# Patient Record
Sex: Female | Born: 1990 | Race: White | Hispanic: No | Marital: Single | Smoking: Never smoker
Health system: Southern US, Community
[De-identification: ages and names within clinical notes are randomized; demographics above are authoritative.]

## PROBLEM LIST (undated history)

## (undated) DIAGNOSIS — N200 Calculus of kidney: Secondary | ICD-10-CM

---

## 2011-11-24 HISTORY — PX: INTESTINAL BYPASS: SHX1099

## 2013-08-13 ENCOUNTER — Encounter (HOSPITAL_COMMUNITY): Payer: Self-pay | Admitting: Emergency Medicine

## 2013-08-13 ENCOUNTER — Emergency Department (HOSPITAL_COMMUNITY)

## 2013-08-13 ENCOUNTER — Emergency Department (HOSPITAL_COMMUNITY)
Admission: EM | Admit: 2013-08-13 | Discharge: 2013-08-13 | Disposition: A | Discharge status: Home / Self Care | Attending: Emergency Medicine | Admitting: Emergency Medicine

## 2013-08-13 DIAGNOSIS — Z3202 Encounter for pregnancy test, result negative: Secondary | ICD-10-CM | POA: Insufficient documentation

## 2013-08-13 DIAGNOSIS — R110 Nausea: Secondary | ICD-10-CM | POA: Insufficient documentation

## 2013-08-13 DIAGNOSIS — Z79899 Other long term (current) drug therapy: Secondary | ICD-10-CM | POA: Insufficient documentation

## 2013-08-13 DIAGNOSIS — N200 Calculus of kidney: Secondary | ICD-10-CM | POA: Insufficient documentation

## 2013-08-13 HISTORY — DX: Calculus of kidney: N20.0

## 2013-08-13 LAB — URINALYSIS, ROUTINE W REFLEX MICROSCOPIC
Glucose, UA: NEGATIVE mg/dL
Hgb urine dipstick: NEGATIVE
Ketones, ur: NEGATIVE mg/dL
Leukocytes, UA: NEGATIVE
Nitrite: NEGATIVE
Protein, ur: NEGATIVE mg/dL
Urobilinogen, UA: 0.2 mg/dL (ref 0.0–1.0)

## 2013-08-13 LAB — CBC WITH DIFFERENTIAL/PLATELET
Basophils Absolute: 0 10*3/uL (ref 0.0–0.1)
Basophils Relative: 0 % (ref 0–1)
Eosinophils Absolute: 0.1 10*3/uL (ref 0.0–0.7)
Eosinophils Relative: 1 % (ref 0–5)
HCT: 34.7 % — ABNORMAL LOW (ref 36.0–46.0)
Lymphocytes Relative: 49 % — ABNORMAL HIGH (ref 12–46)
MCH: 28.3 pg (ref 26.0–34.0)
MCHC: 33.4 g/dL (ref 30.0–36.0)
MCV: 84.6 fL (ref 78.0–100.0)
Monocytes Absolute: 0.8 10*3/uL (ref 0.1–1.0)
Neutrophils Relative %: 41 % — ABNORMAL LOW (ref 43–77)
Platelets: 233 10*3/uL (ref 150–400)
RBC: 4.1 MIL/uL (ref 3.87–5.11)
RDW: 13.5 % (ref 11.5–15.5)

## 2013-08-13 LAB — COMPREHENSIVE METABOLIC PANEL
AST: 18 U/L (ref 0–37)
CO2: 27 mEq/L (ref 19–32)
Calcium: 9.4 mg/dL (ref 8.4–10.5)
Chloride: 104 mEq/L (ref 96–112)
Creatinine, Ser: 0.56 mg/dL (ref 0.50–1.10)
GFR calc non Af Amer: >90 mL/min (ref >90)
Sodium: 139 mEq/L (ref 135–145)
Total Bilirubin: 0.1 mg/dL — ABNORMAL LOW (ref 0.3–1.2)
Total Protein: 6.5 g/dL (ref 6.0–8.3)

## 2013-08-13 LAB — LIPASE, BLOOD: Lipase: 24 U/L (ref 11–59)

## 2013-08-13 LAB — POCT PREGNANCY, URINE: Preg Test, Ur: NEGATIVE

## 2013-08-13 MED ORDER — IOHEXOL 300 MG/ML  SOLN
50.0000 mL | Freq: Once | INTRAMUSCULAR | Status: AC | PRN
  Administered 2013-08-13: 50 mL via ORAL

## 2013-08-13 MED ORDER — OXYCODONE-ACETAMINOPHEN 5-325 MG PO TABS: 1.0000 | ORAL_TABLET | Freq: Three times a day (TID) | ORAL | Status: AC | PRN

## 2013-08-13 MED ORDER — SODIUM CHLORIDE 0.9 % IV SOLN
Freq: Once | INTRAVENOUS | Status: AC
  Administered 2013-08-13: 50 mL/h via INTRAVENOUS

## 2013-08-13 MED ORDER — IOHEXOL 300 MG/ML  SOLN
100.0000 mL | Freq: Once | INTRAMUSCULAR | Status: AC | PRN
  Administered 2013-08-13: 100 mL via INTRAVENOUS

## 2013-08-13 MED ORDER — MORPHINE SULFATE 4 MG/ML IJ SOLN
4.0000 mg | Freq: Once | INTRAMUSCULAR | Status: AC
  Administered 2013-08-13: 4 mg via INTRAVENOUS
  Filled 2013-08-13: qty 1

## 2013-08-13 MED ORDER — CIPROFLOXACIN HCL 250 MG PO TABS: 250.0000 mg | ORAL_TABLET | Freq: Two times a day (BID) | ORAL | Status: AC

## 2013-08-13 MED ORDER — ONDANSETRON HCL 4 MG/2ML IJ SOLN
4.0000 mg | Freq: Once | INTRAMUSCULAR | Status: AC
  Administered 2013-08-13: 4 mg via INTRAVENOUS
  Filled 2013-08-13: qty 2

## 2013-08-13 MED ORDER — ONDANSETRON HCL 4 MG PO TABS: 4.0000 mg | ORAL_TABLET | Freq: Four times a day (QID) | ORAL | Status: AC

## 2013-08-13 NOTE — ED Provider Notes (Signed)
Discussed case with Earley Favor, NP. Transfer of care from Arty Baumgartner, NP at change in shift.  Patient presenting to emergency department with right lower quadrant pain does been ongoing for the past couple of days. At first the abdominal pain was of the bilateral discomfort, but has now become more concentrated on the right lower quadrant. Patient has history of kidney stones.  Urine pregnancy negative. Urine negative for infection and pyelonephritis. CBC negative elevation white blood cell count. CMP negative findings. Lipase negative elevation. CT abdomen and pelvis with contrast be performed to rule out possible appendicitis.  CT abdomen and pelvis with contrast identified no acute abdominal processes, normal appendix pulling appendicitis. Right nephrolithiasis identified-1 mm calcification in the interpolar region of the right renal collecting system, no hydronephrosis or ureterectasis identified.  7:50 AM Patient seen and re-assessed by this provider. Patient reported that she had vague abdominal pain starting on Friday and reported that the pain became more localized to the RLQ described as a sharp, intense pain that is constant to the RLQ. Patient reported that she felt nauseous intermittently. Patient stated that she felt mildly feverish on Friday, but denied taking her temperature. Patient reported that she has history of kidney stones - last episode was in April 2014. Reported that when she gets episodes of kidney stones the pain is mainly on the left side and she presents with a UTI prior to. Patient had bypass surgery in 2012. Denied dizziness, vomiting, diarrhea, hematuria, dysuria, weakness, chest pain, shortness of breath, difficulty breathing.  Patient alert and oriented. Interactive and pleasant throughout exam and interview. Lungs clear to auscultation bilaterally. Heart rate and rhythm normal. Pulses palpable and strong, distal and proximal. Bowel sounds normal active in all 4  quadrants. Discomfort upon palpation to the right lower quadrant. Negative pain upon palpation to the flow quadrant and left upper quadrant-negative Rovsing sign. Negative psoas and obturator. Patient appears comfortable. Rated pain as 2/10. Patient denied nausea. Discussed labs and imaging in great length with patient and her boyfriend. Discussed with patient that she has nephrolithiasis. Discussed with patient plan for discharge. Patient requesting antibiotics if UTI is to develop, reported that she normally gets Ciprofloxacin.   Results for orders placed during the hospital encounter of 08/13/13  CBC WITH DIFFERENTIAL      Result Value Range   WBC 8.5  4.0 - 10.5 K/uL   RBC 4.10  3.87 - 5.11 MIL/uL   Hemoglobin 11.6 (*) 12.0 - 15.0 g/dL   HCT 16.1 (*) 09.6 - 04.5 %   MCV 84.6  78.0 - 100.0 fL   MCH 28.3  26.0 - 34.0 pg   MCHC 33.4  30.0 - 36.0 g/dL   RDW 40.9  81.1 - 91.4 %   Platelets 233  150 - 400 K/uL   Neutrophils Relative % 41 (*) 43 - 77 %   Neutro Abs 3.5  1.7 - 7.7 K/uL   Lymphocytes Relative 49 (*) 12 - 46 %   Lymphs Abs 4.2 (*) 0.7 - 4.0 K/uL   Monocytes Relative 9  3 - 12 %   Monocytes Absolute 0.8  0.1 - 1.0 K/uL   Eosinophils Relative 1  0 - 5 %   Eosinophils Absolute 0.1  0.0 - 0.7 K/uL   Basophils Relative 0  0 - 1 %   Basophils Absolute 0.0  0.0 - 0.1 K/uL  COMPREHENSIVE METABOLIC PANEL      Result Value Range   Sodium 139  135 -  145 mEq/L   Potassium 3.8  3.5 - 5.1 mEq/L   Chloride 104  96 - 112 mEq/L   CO2 27  19 - 32 mEq/L   Glucose, Bld 86  70 - 99 mg/dL   BUN 15  6 - 23 mg/dL   Creatinine, Ser 8.11  0.50 - 1.10 mg/dL   Calcium 9.4  8.4 - 91.4 mg/dL   Total Protein 6.5  6.0 - 8.3 g/dL   Albumin 3.5  3.5 - 5.2 g/dL   AST 18  0 - 37 U/L   ALT 15  0 - 35 U/L   Alkaline Phosphatase 66  39 - 117 U/L   Total Bilirubin 0.1 (*) 0.3 - 1.2 mg/dL   GFR calc non Af Amer >90  >90 mL/min   GFR calc Af Amer >90  >90 mL/min  LIPASE, BLOOD      Result Value Range    Lipase 24  11 - 59 U/L  URINALYSIS, ROUTINE W REFLEX MICROSCOPIC      Result Value Range   Color, Urine YELLOW  YELLOW   APPearance CLEAR  CLEAR   Specific Gravity, Urine 1.021  1.005 - 1.030   pH 5.0  5.0 - 8.0   Glucose, UA NEGATIVE  NEGATIVE mg/dL   Hgb urine dipstick NEGATIVE  NEGATIVE   Bilirubin Urine NEGATIVE  NEGATIVE   Ketones, ur NEGATIVE  NEGATIVE mg/dL   Protein, ur NEGATIVE  NEGATIVE mg/dL   Urobilinogen, UA 0.2  0.0 - 1.0 mg/dL   Nitrite NEGATIVE  NEGATIVE   Leukocytes, UA NEGATIVE  NEGATIVE  POCT PREGNANCY, URINE      Result Value Range   Preg Test, Ur NEGATIVE  NEGATIVE   Ct Abdomen Pelvis W Contrast  08/13/2013   CLINICAL DATA:  right lower abdominal pain, nausea.  EXAM: CT ABDOMEN AND PELVIS WITH CONTRAST  TECHNIQUE: Multidetector CT imaging of the abdomen and pelvis was performed using the standard protocol following bolus administration of intravenous contrast.  CONTRAST:  OMNIPAQUE IOHEXOL 300 MG/ML  SOLN  COMPARISON:  06/15/2012  FINDINGS: Minimal dependent atelectasis in the visualized lung bases. Unremarkable liver, gallbladder, spleen, adrenal glands, left kidney, aorta, portal vein. 1 mm calcification in the interpolar region of the right renal collecting system. No hydronephrosis. No ureterectasis. Previous gastric bypass surgery. Small bowel and colon are nondilated. Normal appendix. Urinary bladder physiologically distended. Uterus and adnexal regions unremarkable. Trace pelvic ascites. No free air. No adenopathy. 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  IMPRESSION: 1. No acute abdominal process. 2. Right nephrolithiasis. 3. Normal appendix.   Electronically Signed   By: Oley Balm M.D.   On: 08/13/2013 06:40   BP 105/53  Pulse 54  Temp(Src) 98.5 F (36.9 C) (Oral)  Resp 16  Ht 5\' 5"  (1.651 m)  Wt 170 lb (77.111 kg)  BMI 28.29 kg/m2  SpO2 98%  LMP 07/30/2013   Patient stable, afebrile. Patient presenting to the ED  with right nephrolithiasis - normal appendix on the CT abdomen and pelvis with contrast. Discussed with patient findings and lab results. Patient's pain controlled in ED setting, as well as nausea. Patient ready to go home. Discharged patient with anti-emetics, pain medications, and Cipro. Referred patient to her PCP and urologist - patient reported that she follows Dr. Andrey Campanile in [place] and reported that she has an appointment with Dr. Andrey Campanile in 2 weeks - recommended patient to call and see if she can see the urologist sooner.  Discussed with patient to drink plenty of water. Had a long discussion with patient regarding what symptoms to watch for. Discussed with patient to continue to monitor symptoms and if symptoms are to worsen or change to report back to the ED - strict return instructions given.  Patient agreed to plan of care, understood, all questions answered.   Raymon Mutton, PA-C 08/13/13 1709

## 2013-08-13 NOTE — ED Provider Notes (Signed)
Medical screening examination/treatment/procedure(s) were performed by non-physician practitioner and as supervising physician I was immediately available for consultation/collaboration.  Alin Hutchins, MD 08/13/13 2353 

## 2013-08-13 NOTE — ED Provider Notes (Signed)
CSN: [id number]     Arrival date & time 08/13/13  0034 History   First MD Initiated Contact with Patient 08/13/13 0210     Chief Complaint  Patient presents with  . Abdominal Pain   (Consider location/radiation/quality/duration/timing/severity/associated sxs/prior Treatment) HPI Comments: Patient reports, 2, days of progressively worsening.  Right lower quadrant pain.  She has a history of kidney stones, but states this pain is totally different.  Her last menstrual cycle was 2 weeks, ago, which was normal.  Denies any dysuria, constipation, diarrhea, but does endorse nausea.  She's been taking over-the-counter Tylenol without any relief of her pain.  Does not have a previous history of ovarian cysts  Patient is a 22 y.o. female presenting with abdominal pain. The history is provided by the patient.  Abdominal Pain Pain location:  RLQ Pain quality: aching and sharp   Pain radiates to:  Does not radiate Pain severity:  Moderate Onset quality:  Gradual Duration:  2 days Timing:  Constant Progression:  Worsening Chronicity:  New Context: not alcohol use, not awakening from sleep, not diet changes, not eating, not laxative use, not medication withdrawal, not previous surgeries, not recent illness, not recent sexual activity, not recent travel, not retching, not sick contacts, not suspicious food intake and not trauma   Relieved by:  Nothing Worsened by:  Urination, palpation and movement Ineffective treatments:  Acetaminophen Associated symptoms: nausea   Associated symptoms: no constipation, no cough, no diarrhea, no dysuria, no fever, no flatus, no hematuria, no vaginal bleeding, no vaginal discharge and no vomiting   Nausea:    Severity:  Moderate   Onset quality:  Gradual   Past Medical History  Diagnosis Date  . Kidney stones    Past Surgical History  Procedure Laterality Date  . Intestinal bypass  2013   History reviewed. No pertinent family history. History  Substance Use  Topics  . Smoking status: Never Smoker   . Smokeless tobacco: Never Used  . Alcohol Use: Yes     Comment: socially   OB History   Grav Para Term Preterm Abortions TAB SAB Ect Mult Living                 Review of Systems  Constitutional: Negative for fever.  Respiratory: Negative for cough.   Gastrointestinal: Positive for nausea and abdominal pain. Negative for vomiting, diarrhea, constipation and flatus.  Genitourinary: Negative for dysuria, frequency, hematuria, flank pain, vaginal bleeding, vaginal discharge and vaginal pain.  Neurological: Negative for dizziness.  All other systems reviewed and are negative.    Allergies  Augmentin  Home Medications   Current Outpatient Rx  Name  Route  Sig  Dispense  Refill  . estradiol (ESTRACE) 2 MG tablet   Oral   Take 2 mg by mouth daily.         Marland Kitchen etonogestrel (IMPLANON) 68 MG IMPL implant   Subcutaneous   Inject 1 each into the skin once.         . ciprofloxacin (CIPRO) 250 MG tablet   Oral   Take 1 tablet (250 mg total) by mouth every 12 (twelve) hours.   10 tablet   0   . ondansetron (ZOFRAN) 4 MG tablet   Oral   Take 1 tablet (4 mg total) by mouth every 6 (six) hours.   12 tablet   0   . oxyCODONE-acetaminophen (PERCOCET/ROXICET) 5-325 MG per tablet   Oral   Take 1 tablet by mouth every 8 (eight)  hours as needed for pain.   11 tablet   0    BP 105/53  Pulse 54  Temp(Src) 98.5 F (36.9 C) (Oral)  Resp 16  Ht 5\' 5"  (1.651 m)  Wt 170 lb (77.111 kg)  BMI 28.29 kg/m2  SpO2 98%  LMP 07/30/2013 Physical Exam  Vitals reviewed. Constitutional: She appears well-developed and well-nourished.  HENT:  Head: Normocephalic.  Eyes: Pupils are equal, round, and reactive to light.  Neck: Normal range of motion.  Cardiovascular: Normal rate and regular rhythm.   Pulmonary/Chest: Effort normal and breath sounds normal.  Abdominal: Soft. She exhibits no distension. Bowel sounds are decreased. There is tenderness  in the right lower quadrant. There is no rebound, no guarding and no tenderness at McBurney's point.      ED Course  Procedures (including critical care time) Labs Review Labs Reviewed  CBC WITH DIFFERENTIAL - Abnormal; Notable for the following:    Hemoglobin 11.6 (*)    HCT 34.7 (*)    Neutrophils Relative % 41 (*)    Lymphocytes Relative 49 (*)    Lymphs Abs 4.2 (*)    All other components within normal limits  COMPREHENSIVE METABOLIC PANEL - Abnormal; Notable for the following:    Total Bilirubin 0.1 (*)    All other components within normal limits  LIPASE, BLOOD  URINALYSIS, ROUTINE W REFLEX MICROSCOPIC  POCT PREGNANCY, URINE   Imaging Review Ct Abdomen Pelvis W Contrast  08/13/2013   CLINICAL DATA:  right lower abdominal pain, nausea.  EXAM: CT ABDOMEN AND PELVIS WITH CONTRAST  TECHNIQUE: Multidetector CT imaging of the abdomen and pelvis was performed using the standard protocol following bolus administration of intravenous contrast.  CONTRAST:  OMNIPAQUE IOHEXOL 300 MG/ML  SOLN  COMPARISON:  06/15/2012  FINDINGS: Minimal dependent atelectasis in the visualized lung bases. Unremarkable liver, gallbladder, spleen, adrenal glands, left kidney, aorta, portal vein. 1 mm calcification in the interpolar region of the right renal collecting system. No hydronephrosis. No ureterectasis. Previous gastric bypass surgery. Small bowel and colon are nondilated. Normal appendix. Urinary bladder physiologically distended. Uterus and adnexal regions unremarkable. Trace pelvic ascites. No free air. No adenopathy. 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  IMPRESSION: 1. No acute abdominal process. 2. Right nephrolithiasis. 3. Normal appendix.   Electronically Signed   By: Oley Balm M.D.   On: 08/13/2013 06:40    MDM   1. Nephrolithiasis         Arman Filter, NP 08/13/13 1947

## 2013-08-13 NOTE — ED Notes (Signed)
Pt reports severe RLQ abdominal pain, hx of kidney stones but states this pain is not similar.  Pt reports nausea without vomiting, denies diarrhea.  Pt denies GU complaints but states straining to cough or when voiding increase pain.

## 2013-08-13 NOTE — ED Notes (Signed)
PA at bedside.

## 2013-08-16 NOTE — ED Provider Notes (Signed)
Medical screening examination/treatment/procedure(s) were performed by non-physician practitioner and as supervising physician I was immediately available for consultation/collaboration.  Sunnie Nielsen, MD 08/16/13 [phone]

## 2013-11-09 ENCOUNTER — Ambulatory Visit (HOSPITAL_BASED_OUTPATIENT_CLINIC_OR_DEPARTMENT_OTHER)
Admission: RE | Admit: 2013-11-09 | Discharge: 2013-11-09 | Disposition: A | Discharge status: Home / Self Care | Source: Ambulatory Visit | Attending: Emergency Medicine | Admitting: Emergency Medicine

## 2013-11-09 ENCOUNTER — Encounter (HOSPITAL_BASED_OUTPATIENT_CLINIC_OR_DEPARTMENT_OTHER): Payer: Self-pay | Admitting: Emergency Medicine

## 2013-11-09 ENCOUNTER — Emergency Department (HOSPITAL_BASED_OUTPATIENT_CLINIC_OR_DEPARTMENT_OTHER)

## 2013-11-09 ENCOUNTER — Ambulatory Visit (HOSPITAL_BASED_OUTPATIENT_CLINIC_OR_DEPARTMENT_OTHER)

## 2013-11-09 ENCOUNTER — Emergency Department (HOSPITAL_BASED_OUTPATIENT_CLINIC_OR_DEPARTMENT_OTHER)
Admission: EM | Admit: 2013-11-09 | Discharge: 2013-11-09 | Disposition: A | Discharge status: Home / Self Care | Attending: Emergency Medicine | Admitting: Emergency Medicine

## 2013-11-09 DIAGNOSIS — N898 Other specified noninflammatory disorders of vagina: Secondary | ICD-10-CM | POA: Insufficient documentation

## 2013-11-09 DIAGNOSIS — Z79899 Other long term (current) drug therapy: Secondary | ICD-10-CM | POA: Insufficient documentation

## 2013-11-09 DIAGNOSIS — M545 Low back pain, unspecified: Secondary | ICD-10-CM | POA: Insufficient documentation

## 2013-11-09 DIAGNOSIS — R102 Pelvic and perineal pain: Secondary | ICD-10-CM

## 2013-11-09 DIAGNOSIS — Z87442 Personal history of urinary calculi: Secondary | ICD-10-CM | POA: Insufficient documentation

## 2013-11-09 DIAGNOSIS — N949 Unspecified condition associated with female genital organs and menstrual cycle: Secondary | ICD-10-CM | POA: Insufficient documentation

## 2013-11-09 DIAGNOSIS — Z3202 Encounter for pregnancy test, result negative: Secondary | ICD-10-CM | POA: Insufficient documentation

## 2013-11-09 LAB — BASIC METABOLIC PANEL
Calcium: 8.9 mg/dL (ref 8.4–10.5)
Creatinine, Ser: 0.6 mg/dL (ref 0.50–1.10)
GFR calc Af Amer: >90 mL/min (ref >90)
GFR calc non Af Amer: >90 mL/min (ref >90)
Glucose, Bld: 83 mg/dL (ref 70–99)
Sodium: 140 mEq/L (ref 135–145)

## 2013-11-09 LAB — CBC WITH DIFFERENTIAL/PLATELET
Basophils Absolute: 0 10*3/uL (ref 0.0–0.1)
Eosinophils Absolute: 0.1 10*3/uL (ref 0.0–0.7)
Eosinophils Relative: 1 % (ref 0–5)
Hemoglobin: 11.3 g/dL — ABNORMAL LOW (ref 12.0–15.0)
MCH: 28.4 pg (ref 26.0–34.0)
MCHC: 32.6 g/dL (ref 30.0–36.0)
MCV: 87.2 fL (ref 78.0–100.0)
Platelets: 227 10*3/uL (ref 150–400)
RDW: 13.8 % (ref 11.5–15.5)

## 2013-11-09 LAB — URINALYSIS, ROUTINE W REFLEX MICROSCOPIC
Bilirubin Urine: NEGATIVE
Glucose, UA: NEGATIVE mg/dL
Hgb urine dipstick: NEGATIVE
Ketones, ur: NEGATIVE mg/dL
Nitrite: NEGATIVE
Protein, ur: NEGATIVE mg/dL
Specific Gravity, Urine: 1.003 — ABNORMAL LOW (ref 1.005–1.030)
Urobilinogen, UA: 0.2 mg/dL (ref 0.0–1.0)

## 2013-11-09 LAB — WET PREP, GENITAL
Trich, Wet Prep: NONE SEEN
Yeast Wet Prep HPF POC: NONE SEEN

## 2013-11-09 MED ORDER — HYDROCODONE-ACETAMINOPHEN 5-325 MG PO TABS: 1.0000 | ORAL_TABLET | Freq: Four times a day (QID) | ORAL | Status: AC | PRN

## 2013-11-09 MED ORDER — MORPHINE SULFATE 4 MG/ML IJ SOLN
4.0000 mg | Freq: Once | INTRAMUSCULAR | Status: AC
  Administered 2013-11-09: 4 mg via INTRAVENOUS
  Filled 2013-11-09: qty 1

## 2013-11-09 MED ORDER — KETOROLAC TROMETHAMINE 30 MG/ML IJ SOLN
30.0000 mg | Freq: Once | INTRAMUSCULAR | Status: AC
  Administered 2013-11-09: 30 mg via INTRAVENOUS
  Filled 2013-11-09: qty 1

## 2013-11-09 NOTE — ED Notes (Signed)
Pt reports previous kidney stone, that required stenting. Now c/o back pain, with increased frequency and increased pain. Pt has taken azo at home and ibuprofen with no relief, dr. Andrey Campanile is urologist in [place]

## 2013-11-09 NOTE — ED Provider Notes (Signed)
CSN: [id number]     Arrival date & time 11/09/13  0010 History   First MD Initiated Contact with Patient 11/09/13 0024     Chief Complaint  Patient presents with  . Flank Pain   (Consider location/radiation/quality/duration/timing/severity/associated sxs/prior Treatment) Patient is a 22 y.o. female presenting with abdominal pain. The history is provided by the patient. No language interpreter was used.  Abdominal Pain Pain location:  Suprapubic (right inguinal and low back pain on that side) Pain quality: sharp   Pain radiates to:  Does not radiate Pain severity:  Moderate Onset quality:  Gradual Duration:  4 days Timing:  Constant Progression:  Waxing and waning Chronicity:  Recurrent Context: not sick contacts   Relieved by:  Nothing Worsened by:  Nothing tried Ineffective treatments:  None tried Associated symptoms: no fever, no vaginal bleeding, no vaginal discharge and no vomiting   Risk factors: not pregnant     Past Medical History  Diagnosis Date  . Kidney stones    Past Surgical History  Procedure Laterality Date  . Intestinal bypass  2013   No family history on file. History  Substance Use Topics  . Smoking status: Never Smoker   . Smokeless tobacco: Never Used  . Alcohol Use: Yes     Comment: socially   OB History   Grav Para Term Preterm Abortions TAB SAB Ect Mult Living                 Review of Systems  Constitutional: Negative for fever.  Gastrointestinal: Positive for abdominal pain. Negative for vomiting.  Genitourinary: Negative for vaginal bleeding and vaginal discharge.  All other systems reviewed and are negative.    Allergies  Augmentin  Home Medications   Current Outpatient Rx  Name  Route  Sig  Dispense  Refill  . ciprofloxacin (CIPRO) 250 MG tablet   Oral   Take 1 tablet (250 mg total) by mouth every 12 (twelve) hours.   10 tablet   0   . estradiol (ESTRACE) 2 MG tablet   Oral   Take 2 mg by mouth daily.         Marland Kitchen  etonogestrel (IMPLANON) 68 MG IMPL implant   Subcutaneous   Inject 1 each into the skin once.         Marland Kitchen HYDROcodone-acetaminophen (NORCO) 5-325 MG per tablet   Oral   Take 1 tablet by mouth every 6 (six) hours as needed.   10 tablet   0   . ondansetron (ZOFRAN) 4 MG tablet   Oral   Take 1 tablet (4 mg total) by mouth every 6 (six) hours.   12 tablet   0   . oxyCODONE-acetaminophen (PERCOCET/ROXICET) 5-325 MG per tablet   Oral   Take 1 tablet by mouth every 8 (eight) hours as needed for pain.   11 tablet   0    BP 129/63  Pulse 92  Temp(Src) 99.2 F (37.3 C) (Oral)  Resp 20  Ht 5\' 5"  (1.651 m)  Wt 180 lb (81.647 kg)  BMI 29.95 kg/m2  SpO2 98%  LMP 10/23/2013 Physical Exam  Constitutional: She appears well-developed and well-nourished. No distress.  HENT:  Head: Normocephalic and atraumatic.  Mouth/Throat: Oropharynx is clear and moist.  Eyes: Conjunctivae are normal. Pupils are equal, round, and reactive to light.  Neck: Normal range of motion. Neck supple.  Cardiovascular: Normal rate and regular rhythm.   Pulmonary/Chest: Effort normal and breath sounds normal.  Abdominal: Soft. Bowel  sounds are normal. There is no tenderness. There is no rebound and no guarding.  Genitourinary: Vaginal discharge found.  Adnexal tenderness right palpable cyst  Musculoskeletal: Normal range of motion.  Neurological: She is alert.  Skin: Skin is warm and dry.  Psychiatric: She has a normal mood and affect.    ED Course  Procedures (including critical care time) Labs Review Labs Reviewed  WET PREP, GENITAL - Abnormal; Notable for the following:    WBC, Wet Prep HPF POC FEW (*)    All other components within normal limits  URINALYSIS, ROUTINE W REFLEX MICROSCOPIC - Abnormal; Notable for the following:    Specific Gravity, Urine 1.003 (*)    All other components within normal limits  CBC WITH DIFFERENTIAL - Abnormal; Notable for the following:    WBC 11.3 (*)    Hemoglobin  11.3 (*)    HCT 34.7 (*)    Monocytes Absolute 1.4 (*)    All other components within normal limits  GC/CHLAMYDIA PROBE AMP  PREGNANCY, URINE  BASIC METABOLIC PANEL   Imaging Review Dg Abd Acute W/chest  11/09/2013   CLINICAL DATA:  Right flank and right lower quadrant pain for 4 days, burning with urination, history kidney stones  EXAM: ACUTE ABDOMEN SERIES (ABDOMEN 2 VIEW & CHEST 1 VIEW)  COMPARISON:  None  FINDINGS: Normal heart size, mediastinal contours, and pulmonary vascularity.  Lungs clear.  No pleural effusion or pneumothorax.  Stool throughout colon.  Nonobstructive bowel gas pattern.  No bowel dilatation, bowel wall thickening, or free intraperitoneal air.  Bones unremarkable.  No definite urinary tract calcification seen.  IMPRESSION: No acute abnormalities.   Electronically Signed   By: Ulyses Southward M.D.   On: 11/09/2013 01:31    EKG Interpretation   None       MDM   1. Pelvic pain    Suspect cyst on ovary as source of pain.  Had a 1 mm stone in the kidney in September.  1 mm stone would not be obstructing and should therefore be associated with hematuria.  No blood in urine nothing seen on KUB.  Have treated pain and will set up outpatient [place].  Follow up with both your GYN and urologist.  Patient verbalizes understanding and agrees to follow up    Catlyn Shipton Smitty Cords, MD 11/09/13 [phone]

## 2013-11-09 NOTE — ED Notes (Signed)
Right flank pain x four days, burning with urination.  History of kidney stones.  Denies fever, nausea, vomiting, hematuria.

## 2013-11-14 ENCOUNTER — Telehealth (HOSPITAL_COMMUNITY): Payer: Self-pay

## 2013-11-14 NOTE — ED Notes (Signed)
Called by RN @ [facility] who rcvd call from [facility] that GC/Chlamydia swab lost.  Pt wasn't treated while in ED.  Will f/u w/MD for further instruction

## 2014-08-22 ENCOUNTER — Telehealth: Payer: Self-pay | Admitting: Gynecology

## 2014-08-22 NOTE — Telephone Encounter (Signed)
Called patient and left message to call back re: new patient doctor referral. Patient has [employer] and needs to know we are not in network.

## 2014-08-24 NOTE — Telephone Encounter (Signed)
Left message for patient to return call.

## 2014-08-27 NOTE — Telephone Encounter (Signed)
Left patient a message to call back.

## 2014-08-29 NOTE — Telephone Encounter (Signed)
Notified patient we are not in network with [employer] and our office policy with regards to that. Patient requested I sent the referral back to [facility] so they can locate an in network provider for her.

## 2015-07-28 IMAGING — US US PELVIS COMPLETE
1 series · 13 of 25 positions shown · non-contrast
Comparison: None

CLINICAL DATA: Right lower quadrant pain, family history of
polycystic ovaries



[Series 1: us pelvis complete · 0.30mm/px · 13 of 73 slices shown]
[im 1/73]
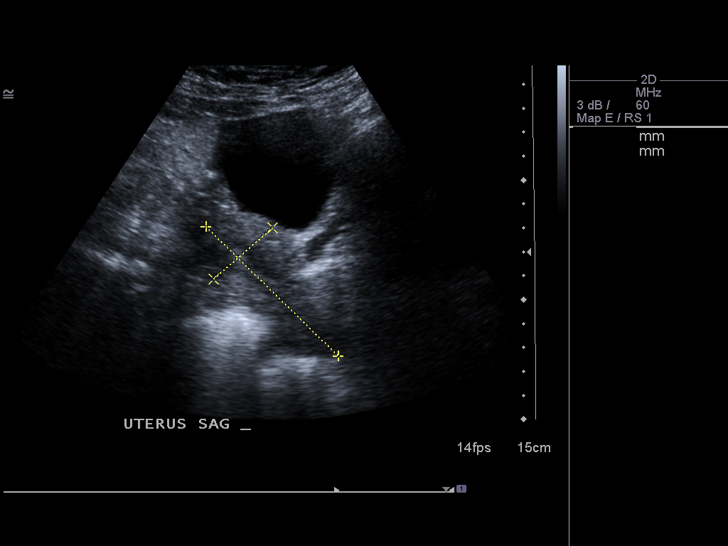
[im 7/73]
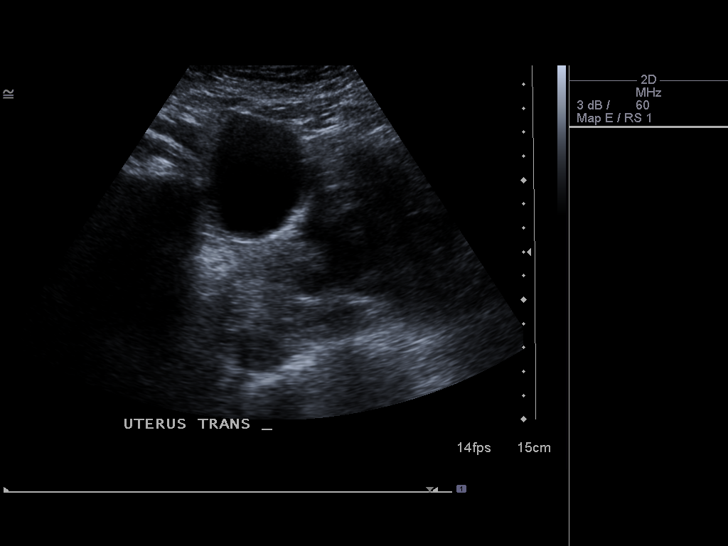
[im 13/73]
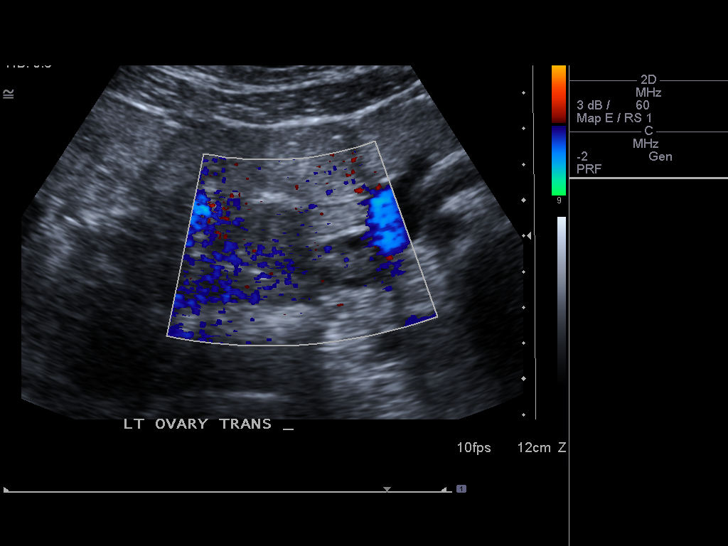
[im 19/73]
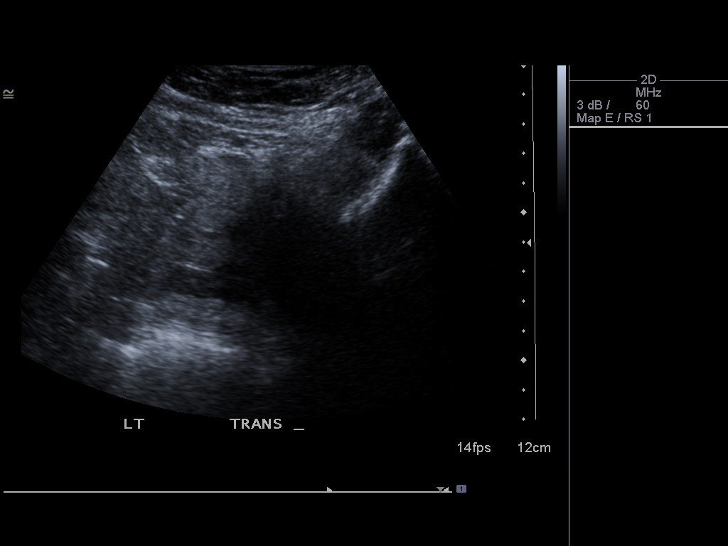
[im 25/73]
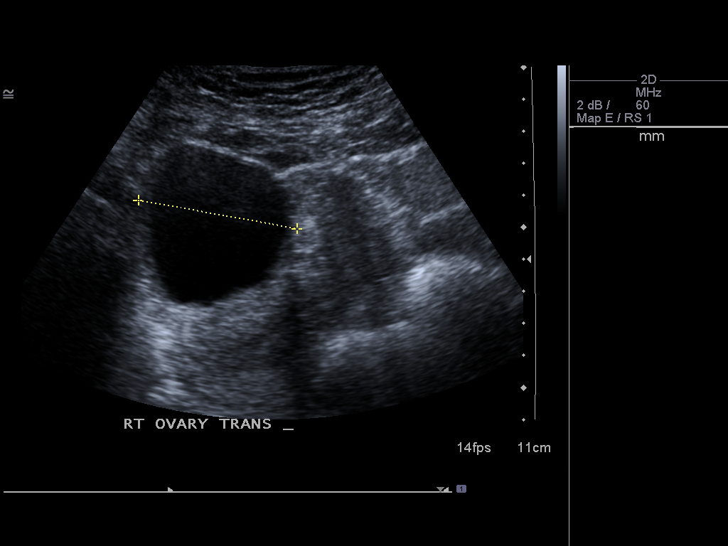
[im 31/73]
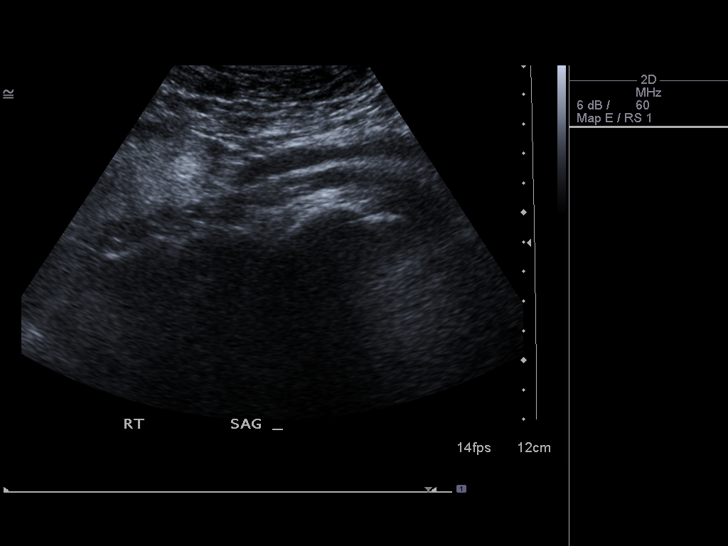
[im 37/73]
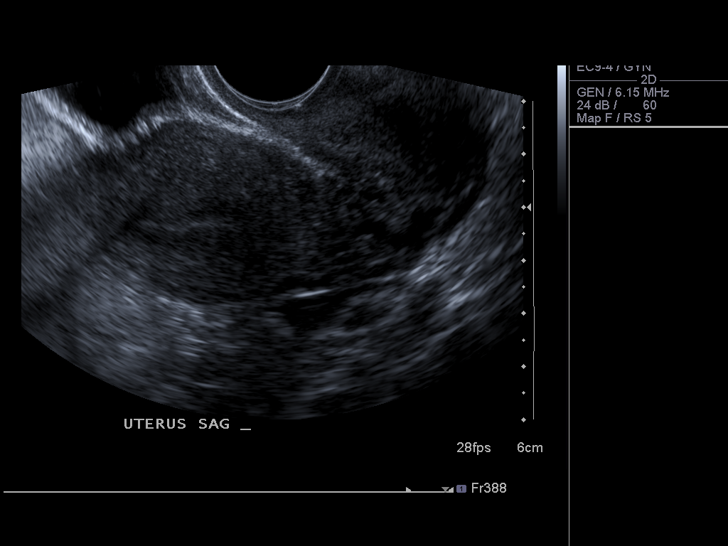
[im 43/73]
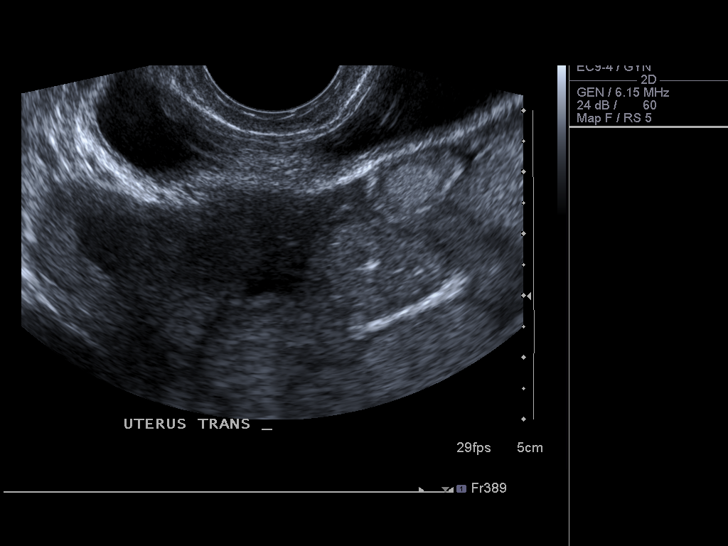
[im 49/73]
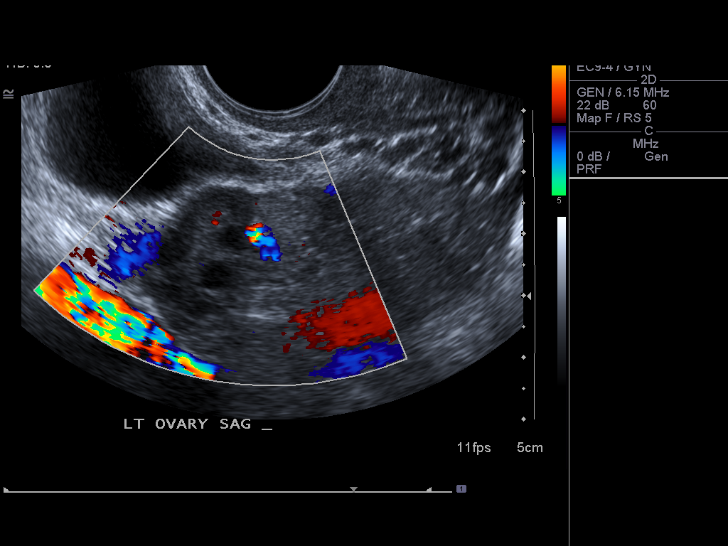
[im 55/73]
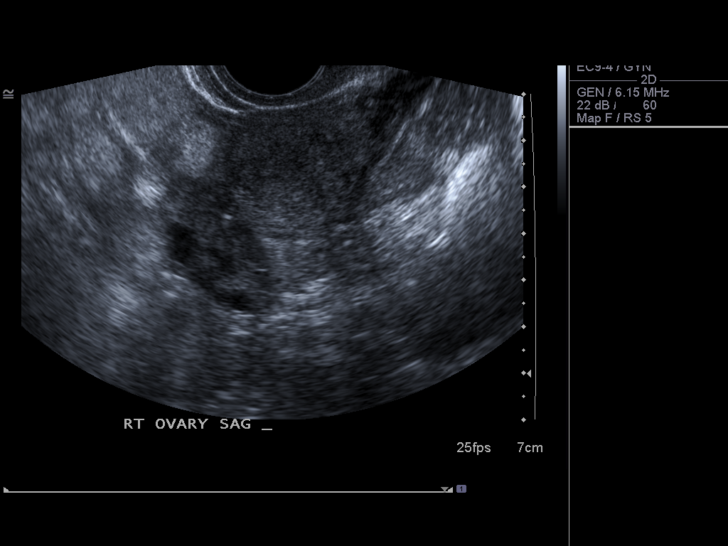
[im 61/73]
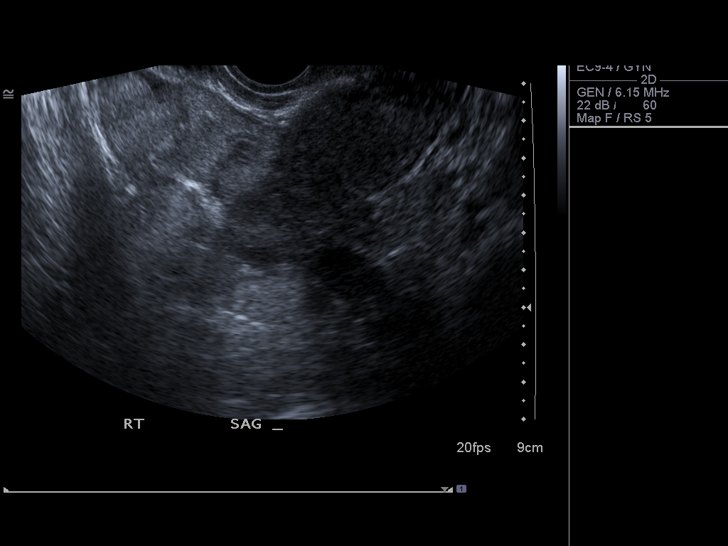
[im 67/73]
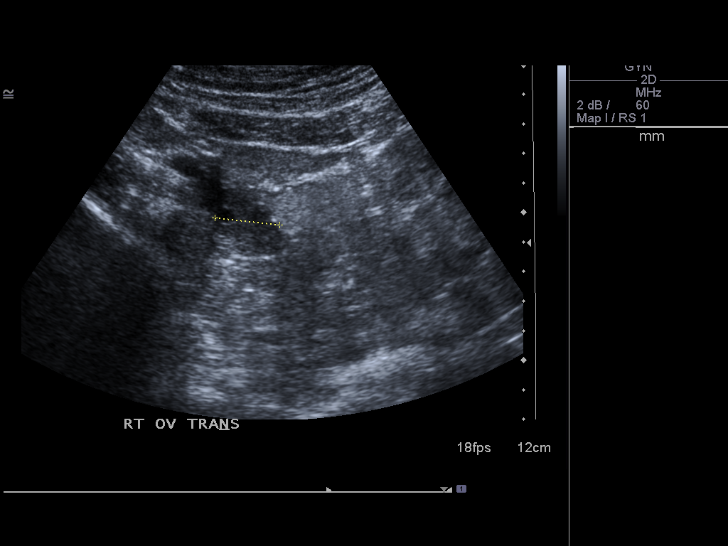
[im 73/73]
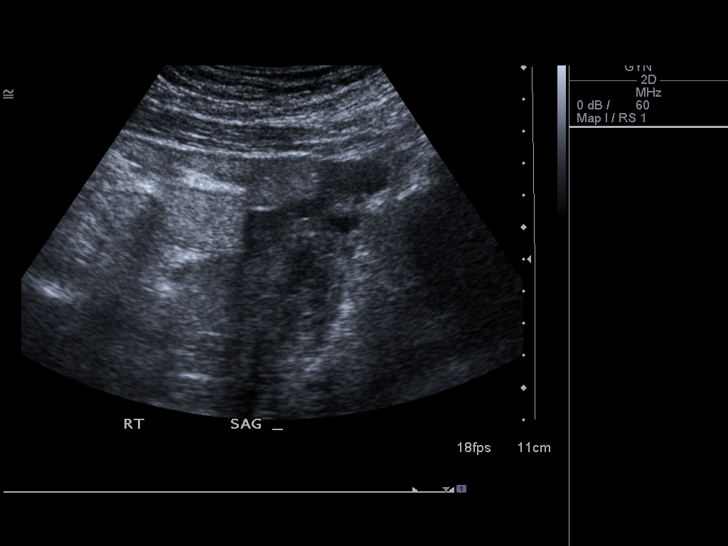

[13 of 25 positions shown; findings below may reference images not displayed]

FINDINGS: Uterus

Measurements: 7.8 x 3.4 x 4.6 cm. No fibroids or other mass
visualized.

Endometrium

Thickness: 3 mm.  No focal abnormality visualized.

Right ovary

Measurements: 3.2 x 1.8 x 2.4 cm. Initially on the transabdominal
examination there was a 6 x 5 x 5.3 cm anechoic mass arising from
bursa is adjacent to the right ovary. On the subsequent transvaginal
examination and repeat transabdominal examination this area is no
longer visualized.

Left ovary

Measurements: 2.6 x 1.7 x 2.3 cm. Normal appearance/no adnexal mass.

Other findings

There is small amount of pelvic free fluid.
IMPRESSION: 1. Initially on the transabdominal examination there was a 6 x 5 x
5.3 cm anechoic mass arising from bursa is adjacent to the right
ovary. On the subsequent transvaginal examination and repeat
transabdominal examination this area is no longer visualized. This
may reflect a enteric duplication cyst versus bladder diverticulum
versus less likely ovarian cyst.

2.  Otherwise unremarkable pelvic ultrasound.
